# Patient Record
Sex: Male | Born: 2000 | Race: Black or African American | Hispanic: No | Marital: Single | Smoking: Never smoker
Health system: Southern US, Community
[De-identification: ages and names within clinical notes are randomized; demographics above are authoritative.]

---
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
## 2022-06-18 IMAGING — CR DG HAND COMPLETE 3+V*L*
3 series · 3 of 3 positions shown · non-contrast
Comparison: None.

CLINICAL DATA: 19-year-old male with laceration of the left hand.

EXAM:
LEFT HAND - COMPLETE 3+ VIEW

[x hand pa left]
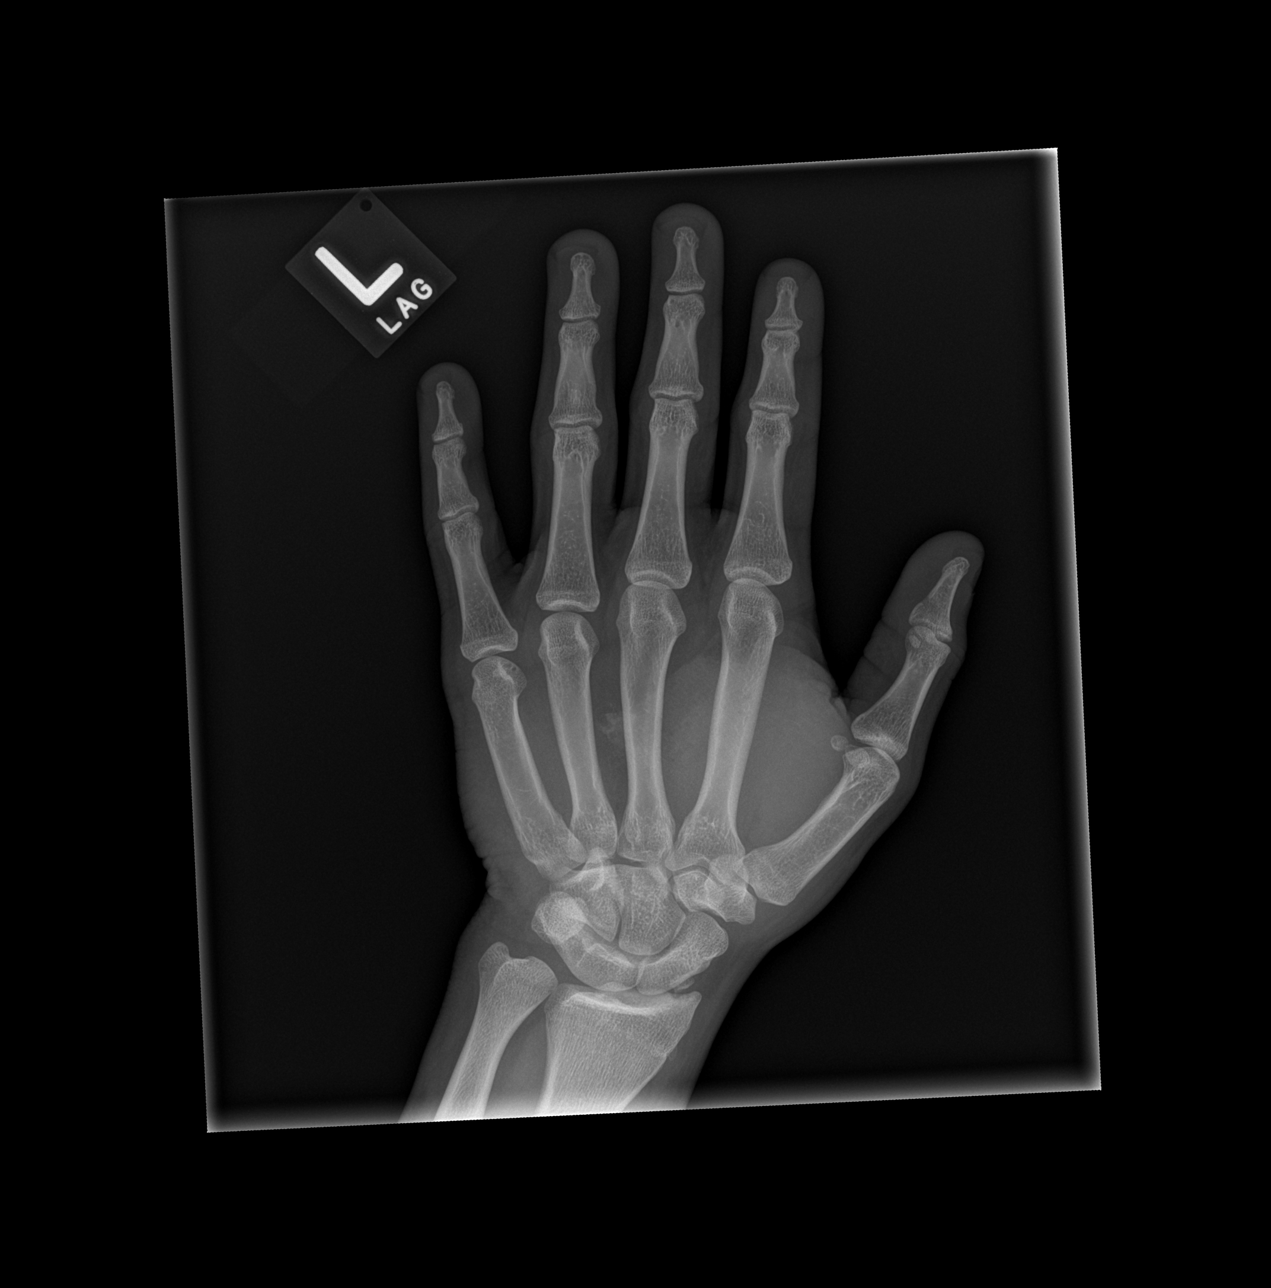

[x hand obl left]
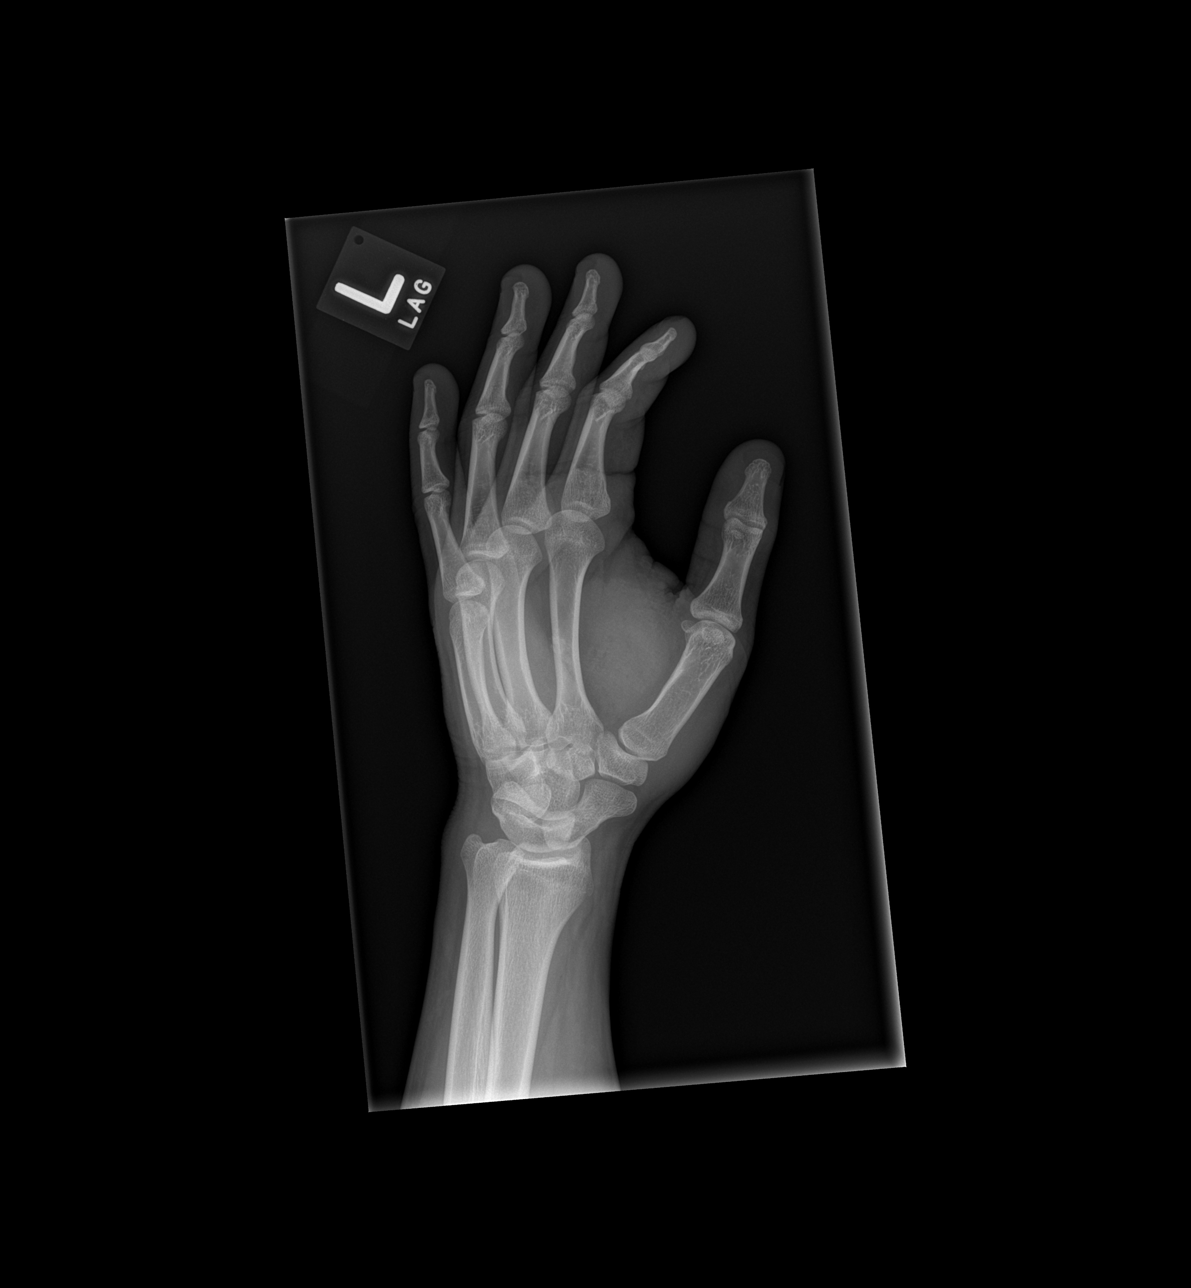

[x hand lat left]
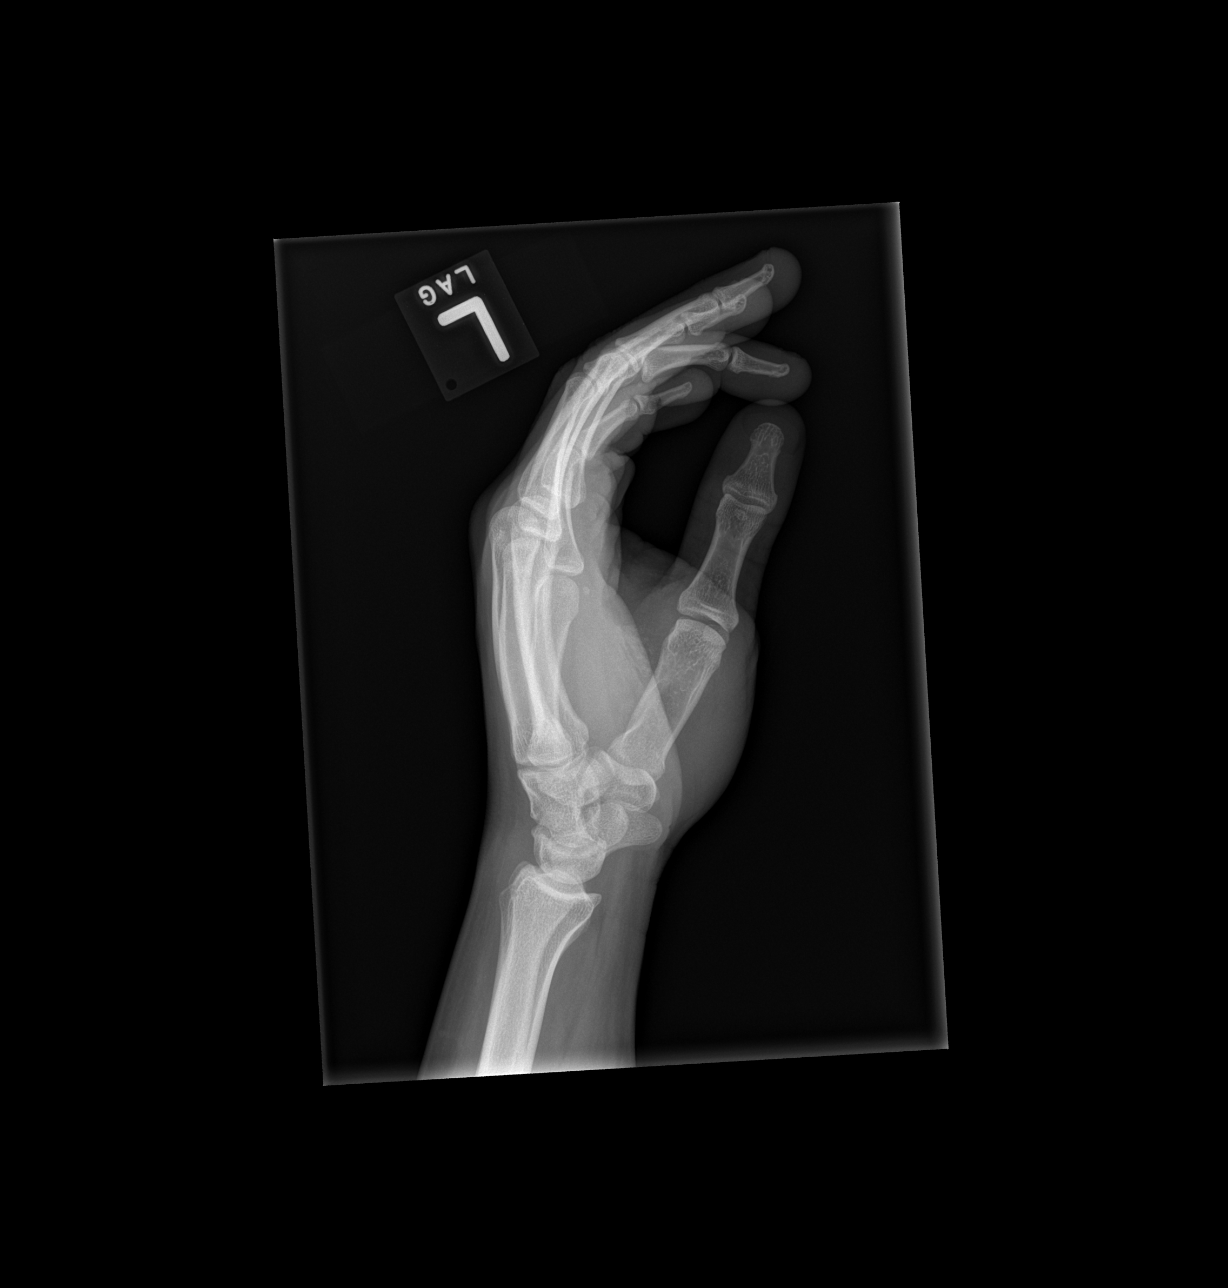

[3 of 3 positions shown; findings below may reference images not displayed]

FINDINGS: There is no acute fracture or dislocation. The bones are well
mineralized. There is laceration of the soft tissues of the palm. No
radiopaque foreign object.
IMPRESSION: 1. No acute osseous pathology.
2. Laceration of the soft tissues of the palm. No radiopaque foreign
object.
